# Patient Record
Sex: Female | Born: 1980
Health system: Southern US, Community
[De-identification: ages and names within clinical notes are randomized; demographics above are authoritative.]

## PROBLEM LIST (undated history)

## (undated) HISTORY — PX: HAND SURGERY: SHX662

## (undated) HISTORY — PX: TONSILLECTOMY: SUR1361

## (undated) HISTORY — PX: HERNIA REPAIR: SHX51

---

## 2007-09-04 ENCOUNTER — Other Ambulatory Visit: Admission: RE | Admit: 2007-09-04 | Discharge: 2007-09-04 | Payer: Self-pay | Admitting: Unknown Physician Specialty

## 2007-09-04 ENCOUNTER — Encounter (INDEPENDENT_AMBULATORY_CARE_PROVIDER_SITE_OTHER): Payer: Self-pay | Admitting: Unknown Physician Specialty

## 2010-03-22 ENCOUNTER — Emergency Department (HOSPITAL_COMMUNITY): Admission: EM | Admit: 2010-03-22 | Discharge: 2010-03-22 | Payer: Self-pay | Admitting: Emergency Medicine

## 2010-08-26 ENCOUNTER — Emergency Department (HOSPITAL_COMMUNITY)
Admission: EM | Admit: 2010-08-26 | Discharge: 2010-08-26 | Payer: Self-pay | Source: Home / Self Care | Admitting: Emergency Medicine

## 2010-11-15 ENCOUNTER — Emergency Department (HOSPITAL_COMMUNITY)
Admission: EM | Admit: 2010-11-15 | Discharge: 2010-11-15 | Payer: Self-pay | Source: Home / Self Care | Admitting: Emergency Medicine

## 2011-01-19 ENCOUNTER — Emergency Department (HOSPITAL_COMMUNITY)
Admission: EM | Admit: 2011-01-19 | Discharge: 2011-01-19 | Disposition: A | Discharge status: Home / Self Care | Payer: Medicaid Other | Attending: Emergency Medicine | Admitting: Emergency Medicine

## 2011-01-19 DIAGNOSIS — R109 Unspecified abdominal pain: Secondary | ICD-10-CM | POA: Insufficient documentation

## 2011-01-19 DIAGNOSIS — A499 Bacterial infection, unspecified: Secondary | ICD-10-CM | POA: Insufficient documentation

## 2011-01-19 DIAGNOSIS — B9689 Other specified bacterial agents as the cause of diseases classified elsewhere: Secondary | ICD-10-CM | POA: Insufficient documentation

## 2011-01-19 DIAGNOSIS — O2390 Unspecified genitourinary tract infection in pregnancy, unspecified trimester: Secondary | ICD-10-CM | POA: Insufficient documentation

## 2011-01-19 DIAGNOSIS — N760 Acute vaginitis: Secondary | ICD-10-CM | POA: Insufficient documentation

## 2011-01-19 LAB — URINALYSIS, ROUTINE W REFLEX MICROSCOPIC
Bilirubin Urine: NEGATIVE
Nitrite: NEGATIVE
Protein, ur: NEGATIVE mg/dL
Specific Gravity, Urine: >1.03 — ABNORMAL HIGH (ref 1.005–1.030)
Urobilinogen, UA: 0.2 mg/dL (ref 0.0–1.0)

## 2011-01-19 LAB — WET PREP, GENITAL: Trich, Wet Prep: NONE SEEN

## 2011-01-20 LAB — URINE CULTURE
Colony Count: NO GROWTH
Culture  Setup Time: 201202221140

## 2011-01-20 LAB — GC/CHLAMYDIA PROBE AMP, GENITAL
Chlamydia, DNA Probe: NEGATIVE
GC Probe Amp, Genital: NEGATIVE

## 2011-02-07 LAB — WET PREP, GENITAL
Trich, Wet Prep: NONE SEEN
Yeast Wet Prep HPF POC: NONE SEEN

## 2011-02-07 LAB — BASIC METABOLIC PANEL
BUN: 14 mg/dL (ref 6–23)
CO2: 26 mEq/L (ref 19–32)
Calcium: 9.2 mg/dL (ref 8.4–10.5)
Chloride: 106 mEq/L (ref 96–112)
Creatinine, Ser: 0.59 mg/dL (ref 0.4–1.2)
Glucose, Bld: 96 mg/dL (ref 70–99)

## 2011-02-07 LAB — CBC
MCH: 31.6 pg (ref 26.0–34.0)
MCHC: 37 g/dL — ABNORMAL HIGH (ref 30.0–36.0)
MCV: 85.4 fL (ref 78.0–100.0)
Platelets: 250 10*3/uL (ref 150–400)

## 2011-02-07 LAB — URINE MICROSCOPIC-ADD ON

## 2011-02-07 LAB — DIFFERENTIAL
Basophils Relative: 0 % (ref 0–1)
Eosinophils Absolute: 0.3 10*3/uL (ref 0.0–0.7)
Eosinophils Relative: 3 % (ref 0–5)
Lymphs Abs: 2.6 10*3/uL (ref 0.7–4.0)
Neutrophils Relative %: 64 % (ref 43–77)

## 2011-02-07 LAB — URINALYSIS, ROUTINE W REFLEX MICROSCOPIC
Glucose, UA: NEGATIVE mg/dL
Leukocytes, UA: NEGATIVE
Protein, ur: 30 mg/dL — AB
Urobilinogen, UA: 0.2 mg/dL (ref 0.0–1.0)

## 2011-02-07 LAB — POCT PREGNANCY, URINE: Preg Test, Ur: POSITIVE

## 2011-02-07 LAB — GC/CHLAMYDIA PROBE AMP, GENITAL: GC Probe Amp, Genital: NEGATIVE

## 2011-02-15 LAB — URINALYSIS, ROUTINE W REFLEX MICROSCOPIC
Glucose, UA: NEGATIVE mg/dL
Nitrite: NEGATIVE
Specific Gravity, Urine: 1.025 (ref 1.005–1.030)
pH: 6 (ref 5.0–8.0)

## 2011-02-15 LAB — PREGNANCY, URINE: Preg Test, Ur: NEGATIVE

## 2011-08-19 ENCOUNTER — Emergency Department (HOSPITAL_COMMUNITY)
Admission: EM | Admit: 2011-08-19 | Discharge: 2011-08-19 | Disposition: A | Discharge status: Home / Self Care | Payer: Medicaid Other | Attending: Emergency Medicine | Admitting: Emergency Medicine

## 2011-08-19 ENCOUNTER — Encounter: Payer: Self-pay | Admitting: *Deleted

## 2011-08-19 DIAGNOSIS — O8600 Infection of obstetric surgical wound, unspecified: Secondary | ICD-10-CM

## 2011-08-19 DIAGNOSIS — O909 Complication of the puerperium, unspecified: Secondary | ICD-10-CM | POA: Insufficient documentation

## 2011-08-19 MED ORDER — SULFAMETHOXAZOLE-TRIMETHOPRIM 800-160 MG PO TABS: 1.0000 | ORAL_TABLET | Freq: Two times a day (BID) | ORAL | Status: AC

## 2011-08-19 MED ORDER — SULFAMETHOXAZOLE-TMP DS 800-160 MG PO TABS
1.0000 | ORAL_TABLET | Freq: Once | ORAL | Status: AC
  Administered 2011-08-19: 1 via ORAL
  Filled 2011-08-19: qty 1

## 2011-08-19 NOTE — ED Provider Notes (Signed)
History   Scribed for Vida Roller, MD, the patient was seen in room APA06/APA06. This chart was scribed by Clarita Crane. This patient's care was started at 11:15PM.   CSN: [id number] Arrival date & time: 08/19/2011 10:00 PM  Chief Complaint  Patient presents with  . Coagulation Disorder    HPI   HPI Nicole Curtis is a 30 y.o. female who presents to the Emergency Department complaining of drainage from left side of incision site to abdomen c/w C-section performed on July 19, 2011 onset several days ago and persistent since with associated mild pain to area which is aggravated with palpation. Patient reports physician applied "glue" to incision site following the C-section performed and much of the glue is still present. Denies vaginal bleeding, fever, chills. Patient reports she received Depo-provera shot several weeks ago.   History reviewed. No pertinent past medical history.  Past Surgical History  Procedure Date  . Cesarean section   . Tonsillectomy   . Hernia repair     History reviewed. No pertinent family history.  History  Substance Use Topics  . Smoking status: Never Smoker   . Smokeless tobacco: Not on file  . Alcohol Use: No    OB History    Grav Para Term Preterm Abortions TAB SAB Ect Mult Living                  Review of Systems  Review of Systems 10 Systems reviewed and are negative for acute change except as noted in the HPI.  Allergies  Review of patient's allergies indicates no known allergies.  Home Medications   Current Outpatient Rx  Name Route Sig Dispense Refill  . MEDROXYPROGESTERONE ACETATE 150 MG/ML IM SUSP Intramuscular Inject 150 mg into the muscle every 3 (three) months.      . SULFAMETHOXAZOLE-TRIMETHOPRIM 800-160 MG PO TABS Oral Take 1 tablet by mouth every 12 (twelve) hours. 20 tablet 0    Physical Exam    BP 156/86  Pulse 65  Temp(Src) 98 F (36.7 C) (Oral)  Resp 20  Ht 5\' 4"  (1.626 m)  Wt 170 lb 3 oz (77.197 kg)   BMI 29.21 kg/m2  SpO2 100%  LMP 08/17/2011  Physical Exam  Nursing note and vitals reviewed. Constitutional: She is oriented to person, place, and time. She appears well-developed and well-nourished. No distress.  HENT:  Head: Normocephalic and atraumatic.  Eyes: EOM are normal.  Neck: Neck supple.  Cardiovascular: Normal rate and regular rhythm.  Exam reveals no gallop and no friction rub.   No murmur heard. Pulmonary/Chest: Effort normal and breath sounds normal. She has no wheezes.  Abdominal: Soft. Bowel sounds are normal. She exhibits no distension. There is no tenderness.       Left edge of incision to mid abdomen with spot of purulent drainage, with no surrounding erythema or induration. No peritoneal signs.  No palpable abscess and wound check. No peritoneal signs, no guarding, no tenderness to palpation anywhere in the abdomen.  Musculoskeletal: Normal range of motion. She exhibits no edema.  Neurological: She is alert and oriented to person, place, and time. No sensory deficit.  Skin: Skin is warm and dry. No erythema.  Psychiatric: She has a normal mood and affect. Her behavior is normal.    ED Course  Procedures  Labs Reviewed - No data to display No results found.   1. Wound infection following cesarean section, postpartum      MDM Overall the patient is very  well-appearing, will send wound culture and start on Septra. She is not breast-feeding, has no fever, has no abdominal tenderness or palpable abscess. It appears that she does have a very small postop wound infection which at this time does not require any further intervention. I have discussed with her the need for followup on Monday morning with her OB/GYN for a wound check and she is aware of indications for emergency department followup.  I personally performed the services described in this documentation, which was scribed in my presence. The recorded information has been reviewed and considered. Vida Roller, MD          Vida Roller, MD 08/19/11 [phone]

## 2011-08-19 NOTE — ED Notes (Signed)
Pt states she was at work and done something that caused her to have bleeding from her C-section site

## 2012-08-27 ENCOUNTER — Emergency Department (HOSPITAL_COMMUNITY)
Admission: EM | Admit: 2012-08-27 | Discharge: 2012-08-27 | Disposition: A | Discharge status: Home / Self Care | Payer: Worker's Compensation | Attending: Emergency Medicine | Admitting: Emergency Medicine

## 2012-08-27 ENCOUNTER — Encounter (HOSPITAL_COMMUNITY): Payer: Self-pay | Admitting: *Deleted

## 2012-08-27 ENCOUNTER — Emergency Department (HOSPITAL_COMMUNITY): Payer: Worker's Compensation

## 2012-08-27 DIAGNOSIS — S60212A Contusion of left wrist, initial encounter: Secondary | ICD-10-CM

## 2012-08-27 DIAGNOSIS — W1809XA Striking against other object with subsequent fall, initial encounter: Secondary | ICD-10-CM | POA: Insufficient documentation

## 2012-08-27 DIAGNOSIS — S63509A Unspecified sprain of unspecified wrist, initial encounter: Secondary | ICD-10-CM | POA: Insufficient documentation

## 2012-08-27 DIAGNOSIS — Y9269 Other specified industrial and construction area as the place of occurrence of the external cause: Secondary | ICD-10-CM | POA: Insufficient documentation

## 2012-08-27 NOTE — ED Provider Notes (Signed)
History     CSN: [id number]  Arrival date & time 08/27/12  1517   First MD Initiated Contact with Patient 08/27/12 1542      Chief Complaint  Patient presents with  . Wrist Injury    (Consider location/radiation/quality/duration/timing/severity/associated sxs/prior treatment) HPI.Marland KitchenMarland KitchenLeft Wrist pain after falling from ladder at work this morning.  Wrist struck machine.  No head or neck injury. Palpation makes pain worse. Severity is mild. History reviewed. No pertinent past medical history.  Past Surgical History  Procedure Date  . Cesarean section   . Tonsillectomy   . Hernia repair     History reviewed. No pertinent family history.  History  Substance Use Topics  . Smoking status: Never Smoker   . Smokeless tobacco: Not on file  . Alcohol Use: No    OB History    Grav Para Term Preterm Abortions TAB SAB Ect Mult Living                  Review of Systems  All other systems reviewed and are negative.    Allergies  Review of patient's allergies indicates no known allergies.  Home Medications  No current outpatient prescriptions on file.  BP 126/80  Pulse 93  Temp 98.3 F (36.8 C) (Oral)  Resp 20  Ht 5\' 4"  (1.626 m)  Wt 150 lb (68.04 kg)  BMI 25.75 kg/m2  SpO2 99%  LMP 08/10/2012  Physical Exam  Nursing note and vitals reviewed. Constitutional: She is oriented to person, place, and time. She appears well-developed and well-nourished.  HENT:  Head: Normocephalic and atraumatic.  Eyes: Conjunctivae normal and EOM are normal. Pupils are equal, round, and reactive to light.  Neck: Normal range of motion. Neck supple.  Cardiovascular: Normal rate, regular rhythm and normal heart sounds.   Pulmonary/Chest: Effort normal and breath sounds normal.  Abdominal: Soft. Bowel sounds are normal.  Musculoskeletal:       Left wrist: Tender in radial and ulnar aspect of wrist.  Pain with range of motion.  Neurological: She is alert and oriented to person, place,  and time.  Skin: Skin is warm and dry.  Psychiatric: She has a normal mood and affect.    ED Course  Procedures (including critical care time)  Labs Reviewed - No data to display Dg Wrist Complete Left  08/27/2012  *RADIOLOGY REPORT*  Clinical Data: Fall from ladder  LEFT WRIST - COMPLETE 3+ VIEW  Comparison: None.  Findings: No distal radius or ulnar fracture.  The radiocarpal joint is intact.  No carpal fracture.  IMPRESSION: No wrist fracture.   Original Report Authenticated By: Genevive Bi, M.D.      1. Strain of left wrist       MDM  X-ray left wrist negative.  No other injuries.  Spl[place], workers comp sheet filled out        Donnetta Hutching, MD 08/27/12 [phone]

## 2012-08-27 NOTE — ED Notes (Signed)
Patient transported to X-ray 

## 2012-08-27 NOTE — ED Notes (Signed)
MD at bedside. 

## 2012-08-27 NOTE — ED Notes (Signed)
Lt wrist injury this am at work, was on step ladder and tipped over , striking wrist on a machine,

## 2013-03-10 ENCOUNTER — Encounter (HOSPITAL_COMMUNITY): Payer: Self-pay | Admitting: *Deleted

## 2013-03-10 ENCOUNTER — Emergency Department (HOSPITAL_COMMUNITY)
Admission: EM | Admit: 2013-03-10 | Discharge: 2013-03-10 | Disposition: A | Discharge status: Home / Self Care | Payer: Medicaid Other | Attending: Emergency Medicine | Admitting: Emergency Medicine

## 2013-03-10 DIAGNOSIS — G43909 Migraine, unspecified, not intractable, without status migrainosus: Secondary | ICD-10-CM

## 2013-03-10 DIAGNOSIS — H538 Other visual disturbances: Secondary | ICD-10-CM | POA: Insufficient documentation

## 2013-03-10 DIAGNOSIS — R112 Nausea with vomiting, unspecified: Secondary | ICD-10-CM | POA: Insufficient documentation

## 2013-03-10 DIAGNOSIS — H53149 Visual discomfort, unspecified: Secondary | ICD-10-CM | POA: Insufficient documentation

## 2013-03-10 MED ORDER — NAPROXEN 500 MG PO TABS: 500.0000 mg | ORAL_TABLET | Freq: Two times a day (BID) | ORAL | Status: DC

## 2013-03-10 MED ORDER — OXYCODONE-ACETAMINOPHEN 5-325 MG PO TABS
1.0000 | ORAL_TABLET | Freq: Once | ORAL | Status: AC
  Administered 2013-03-10: 1 via ORAL
  Filled 2013-03-10: qty 1

## 2013-03-10 MED ORDER — ONDANSETRON 8 MG PO TBDP
8.0000 mg | ORAL_TABLET | Freq: Once | ORAL | Status: AC
  Administered 2013-03-10: 8 mg via ORAL
  Filled 2013-03-10: qty 1

## 2013-03-10 MED ORDER — METOCLOPRAMIDE HCL 5 MG/ML IJ SOLN
10.0000 mg | Freq: Once | INTRAMUSCULAR | Status: AC
  Administered 2013-03-10: 10 mg via INTRAMUSCULAR
  Filled 2013-03-10: qty 2

## 2013-03-10 MED ORDER — KETOROLAC TROMETHAMINE 60 MG/2ML IM SOLN
60.0000 mg | Freq: Once | INTRAMUSCULAR | Status: AC
  Administered 2013-03-10: 60 mg via INTRAMUSCULAR
  Filled 2013-03-10: qty 2

## 2013-03-10 MED ORDER — ONDANSETRON 8 MG PO TBDP: 8.0000 mg | ORAL_TABLET | Freq: Three times a day (TID) | ORAL | Status: DC | PRN

## 2013-03-10 NOTE — ED Notes (Signed)
Pt c/o migraine x 2days with n/v

## 2013-03-10 NOTE — ED Provider Notes (Signed)
History     CSN: [id number]  Arrival date & time 03/10/13  1933   First MD Initiated Contact with Patient 03/10/13 1956      Chief Complaint  Patient presents with  . Headache  . Nausea  . Emesis    (Consider location/radiation/quality/duration/timing/severity/associated sxs/prior treatment) The history is provided by the patient.   patient reports development of headache over the past 24 hours.  She has photophobia without phonophobia.  Bilateral blurred vision.  No weakness of her upper lower extremities.  No recent falls or head injury.  No anticoagulant use.  Her headache was gradual in onset.  Her headache is mild to moderate in severity.  It is not relieved with any over-the-counter medications.  She has had a headache one other time before like this and was diagnosed as a migraine headache.  She has not seen a neurologist.  No fevers or chills.  No neck pain or neck stiffness.  No chest pain or shortness of breath.  No abdominal pain.  She has had some diarrhea the past 24 hours.  She's otherwisebeen able to keep somefluids down today.  No urinary symptoms  History reviewed. No pertinent past medical history.  Past Surgical History  Procedure Laterality Date  . Cesarean section    . Tonsillectomy    . Hernia repair    . Hand surgery      History reviewed. No pertinent family history.  History  Substance Use Topics  . Smoking status: Never Smoker   . Smokeless tobacco: Not on file  . Alcohol Use: No    OB History   Grav Para Term Preterm Abortions TAB SAB Ect Mult Living                  Review of Systems  Gastrointestinal: Positive for vomiting.  Neurological: Positive for headaches.  All other systems reviewed and are negative.    Allergies  Review of patient's allergies indicates no known allergies.  Home Medications   Current Outpatient Rx  Name  Route  Sig  Dispense  Refill  . naproxen (NAPROSYN) 500 MG tablet   Oral   Take 1 tablet (500 mg  total) by mouth 2 (two) times daily.   10 tablet   0   . ondansetron (ZOFRAN ODT) 8 MG disintegrating tablet   Oral   Take 1 tablet (8 mg total) by mouth every 8 (eight) hours as needed for nausea.   10 tablet   0     BP 119/84  Pulse 113  Temp(Src) 97.9 F (36.6 C) (Oral)  Resp 20  Ht 5\' 4"  (1.626 m)  Wt 160 lb (72.576 kg)  BMI 27.45 kg/m2  SpO2 100%  LMP 03/03/2013  Physical Exam  Nursing note and vitals reviewed. Constitutional: She is oriented to person, place, and time. She appears well-developed and well-nourished. No distress.  HENT:  Head: Normocephalic and atraumatic.  Eyes: EOM are normal. Pupils are equal, round, and reactive to light.  Neck: Normal range of motion.  Cardiovascular: Normal rate, regular rhythm and normal heart sounds.   Pulmonary/Chest: Effort normal and breath sounds normal.  Abdominal: Soft. She exhibits no distension. There is no tenderness.  Musculoskeletal: Normal range of motion.  Neurological: She is alert and oriented to person, place, and time.  5/5 strength in major muscle groups of  bilateral upper and lower extremities. Speech normal. No facial asymetry.   Skin: Skin is warm and dry.  Psychiatric: She has a normal  mood and affect. Judgment normal.    ED Course  Procedures (including critical care time)  Labs Reviewed - No data to display No results found.   1. Migraine       MDM   Non focal neuro exam. No recent head trauma. No fever. Doubt meningitis. Doubt intracranial bleed. Doubt normal pressure hydrocephalus. No indication for imaging. Will treat with migraine cocktail and reevaluate  8:32 PM Patient feels much better at this time.  Discharge home in good condition.  Home with instructions for Naprosyn use         Lyanne Co, MD 03/10/13 2034

## 2013-05-24 IMAGING — CR DG WRIST COMPLETE 3+V*L*
4 series · 4 of 4 positions shown · non-contrast
Comparison: None.

CLINICAL DATA: Fall from ladder

LEFT WRIST - COMPLETE 3+ VIEW

[view not recorded (1 of 4)]
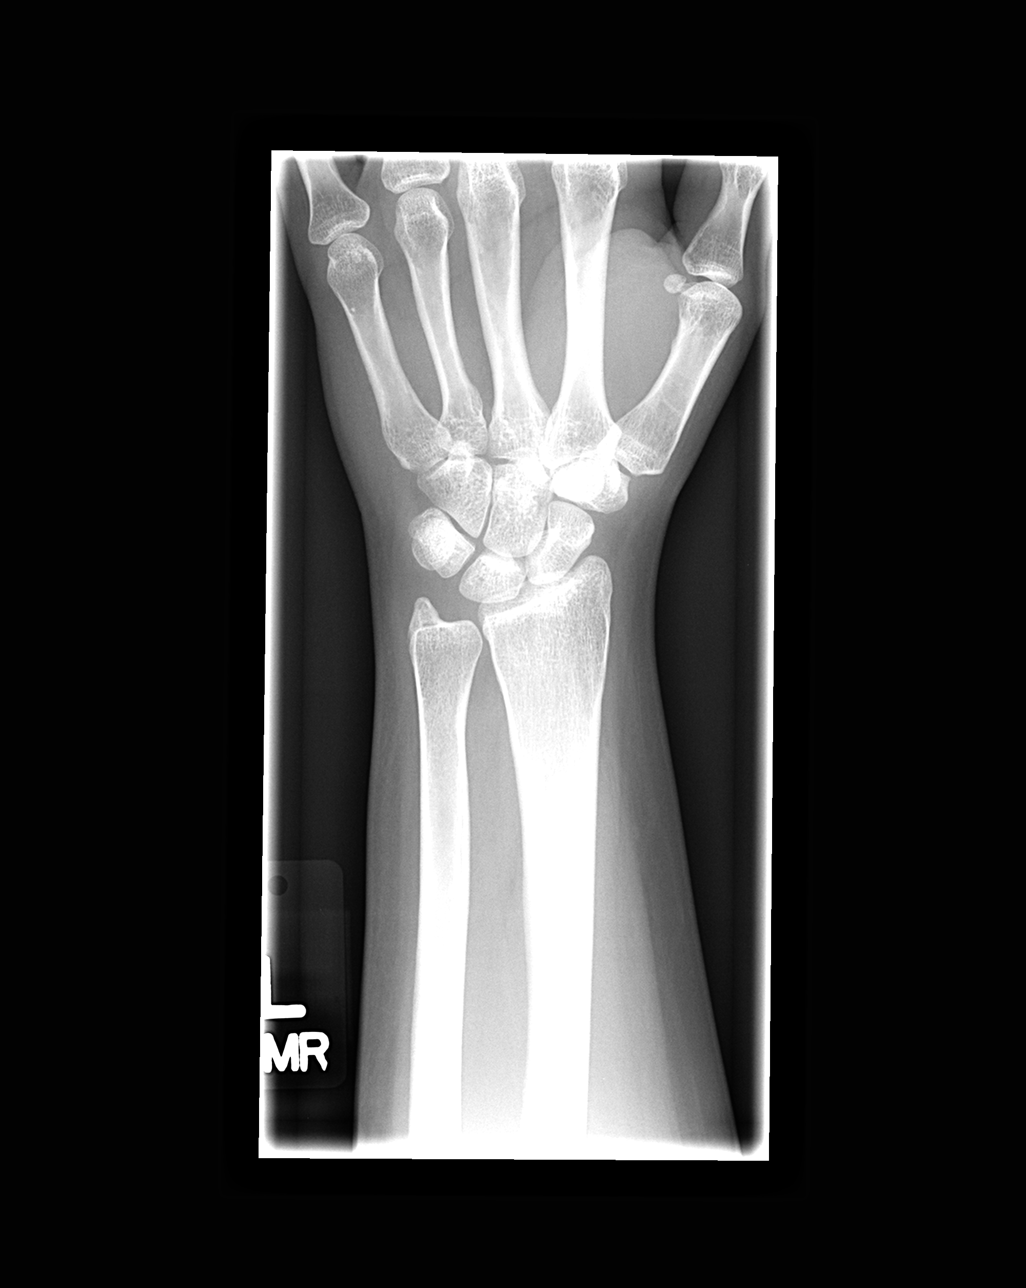

[view not recorded (2 of 4)]
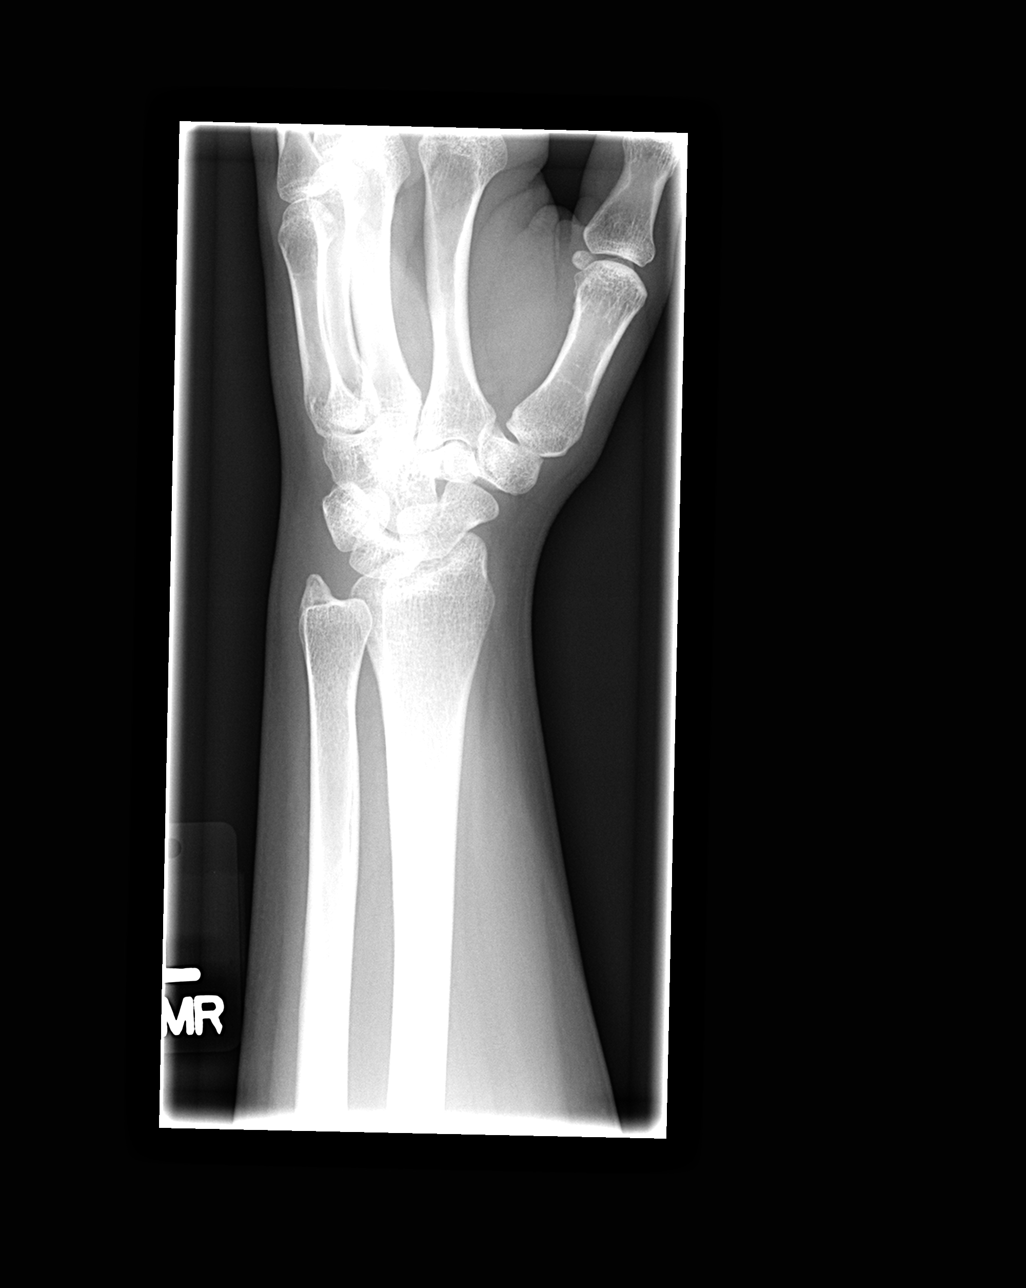

[view not recorded (3 of 4)]
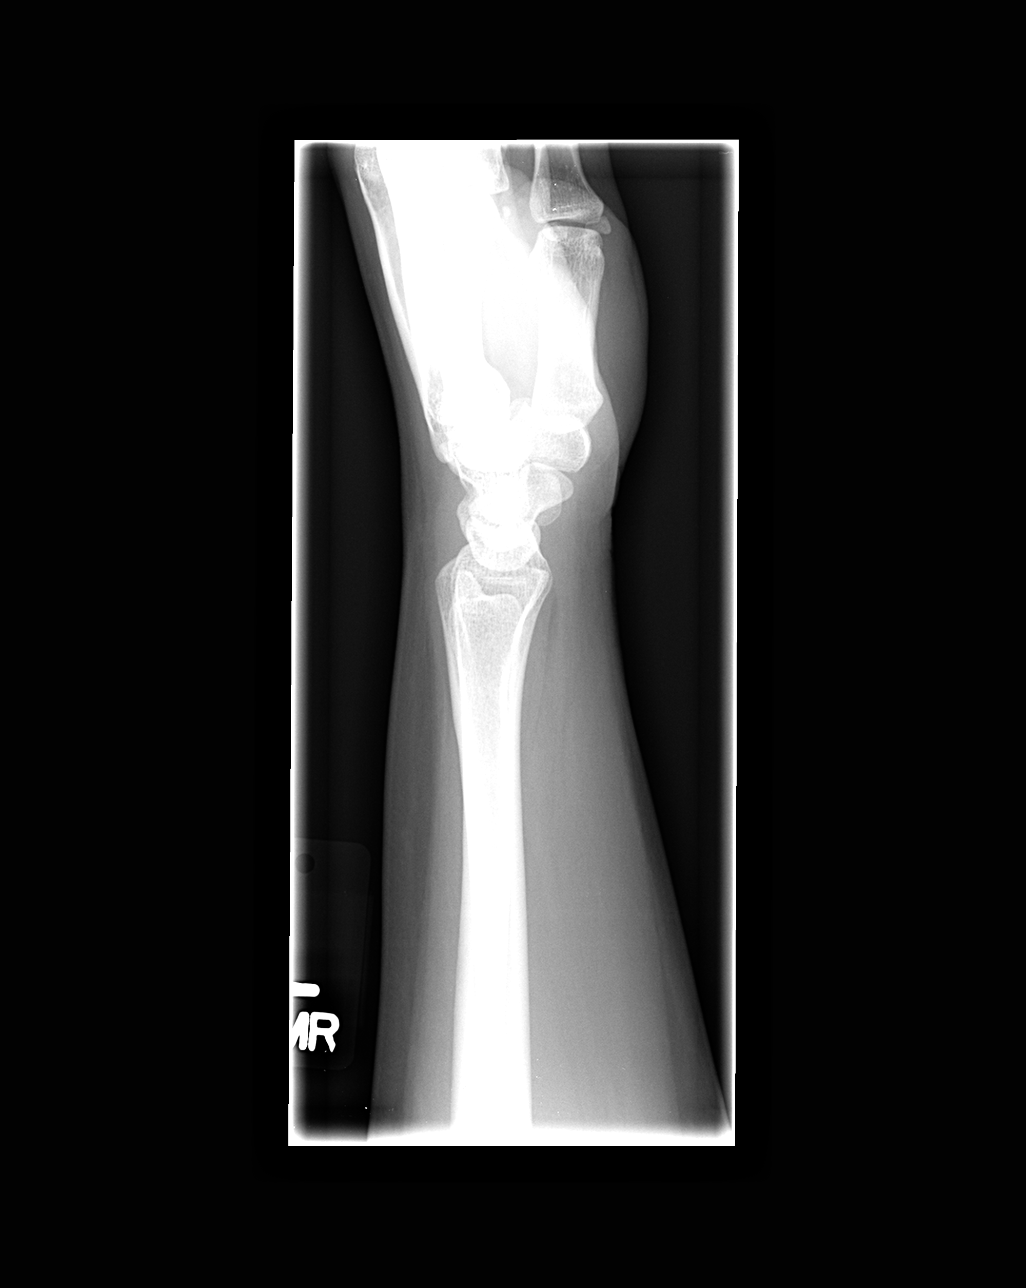

[view not recorded (4 of 4)]
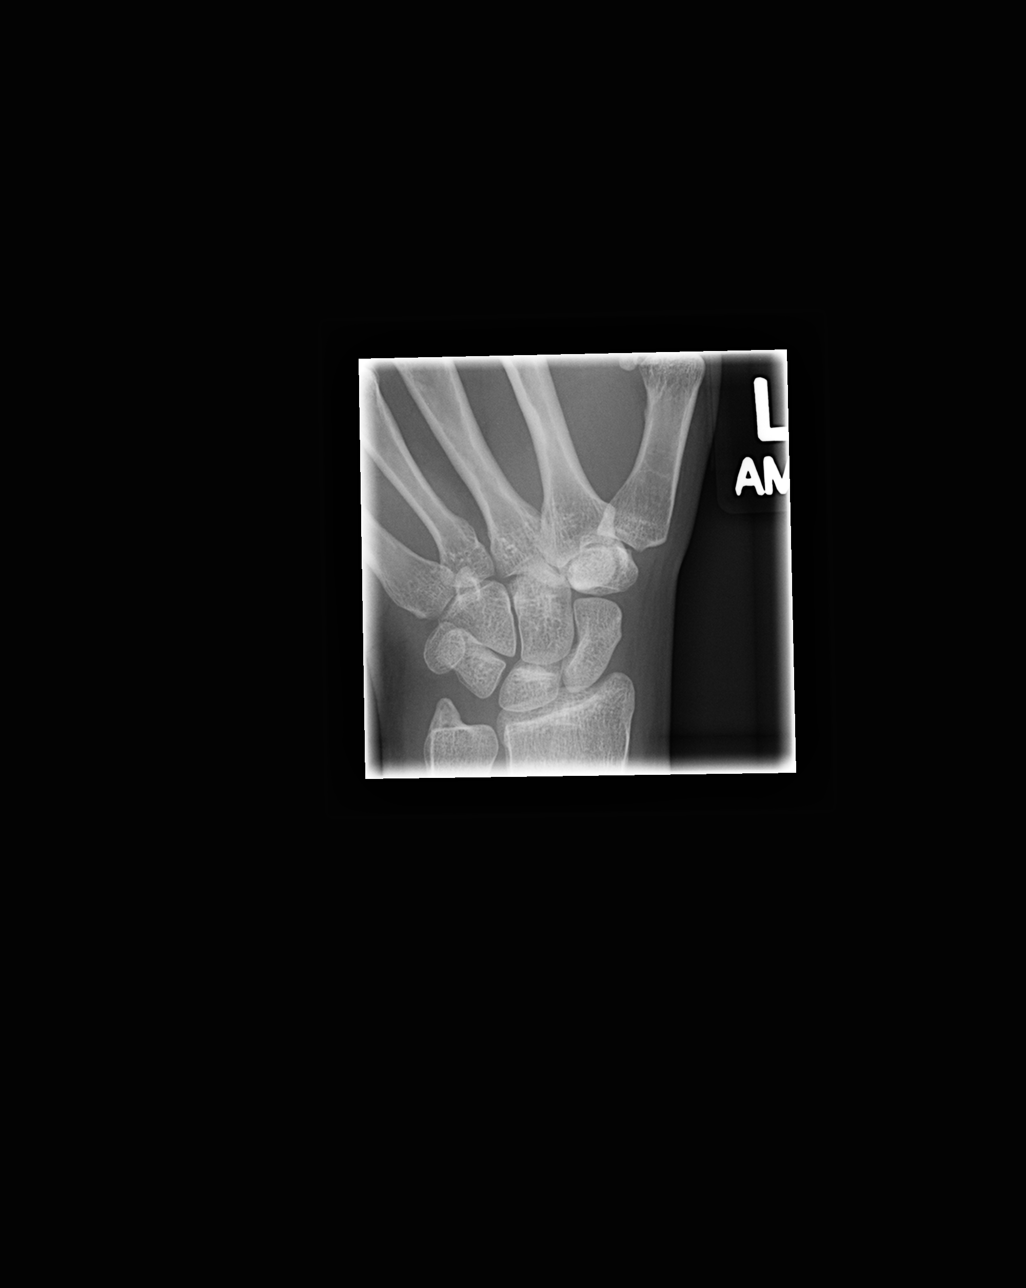

[4 of 4 positions shown; findings below may reference images not displayed]

FINDINGS: No distal radius or ulnar fracture.  The radiocarpal
joint is intact.  No carpal fracture.
IMPRESSION: No wrist fracture.

## 2014-11-01 ENCOUNTER — Emergency Department (HOSPITAL_COMMUNITY)
Admission: EM | Admit: 2014-11-01 | Discharge: 2014-11-01 | Disposition: A | Discharge status: Home / Self Care | Payer: BC Managed Care – PPO | Attending: Emergency Medicine | Admitting: Emergency Medicine

## 2014-11-01 ENCOUNTER — Encounter (HOSPITAL_COMMUNITY): Payer: Self-pay | Admitting: Emergency Medicine

## 2014-11-01 DIAGNOSIS — J029 Acute pharyngitis, unspecified: Secondary | ICD-10-CM | POA: Diagnosis present

## 2014-11-01 DIAGNOSIS — Z79899 Other long term (current) drug therapy: Secondary | ICD-10-CM | POA: Insufficient documentation

## 2014-11-01 DIAGNOSIS — Z791 Long term (current) use of non-steroidal anti-inflammatories (NSAID): Secondary | ICD-10-CM | POA: Diagnosis not present

## 2014-11-01 DIAGNOSIS — B349 Viral infection, unspecified: Secondary | ICD-10-CM

## 2014-11-01 LAB — RAPID STREP SCREEN (MED CTR MEBANE ONLY): STREPTOCOCCUS, GROUP A SCREEN (DIRECT): NEGATIVE

## 2014-11-01 NOTE — Discharge Instructions (Signed)
Strep test negative.  Increase fluids.  Tylenol or ibuprofen for pain or fever. Gargle with salt water. Good handwashing.

## 2014-11-01 NOTE — ED Notes (Addendum)
Patient complaining of sore throat x 3-4 days with chills starting today. Patient also complaining of generalized body aches.

## 2014-11-01 NOTE — ED Provider Notes (Signed)
CSN: [id number]     Arrival date & time 11/01/14  1154 History  This chart was scribed for Nicole Curtis by Nicole Curtis, ED Scribe. This patient was seen in room APA03/APA03 and the patient's care was started at 1:40 PM.    Chief Complaint  Patient presents with  . Sore Throat  . Chills   The history is provided by the patient. No language interpreter was used.    HPI Comments: Nicole Curtis is a 33 y.o. female who presents to the Emergency Department complaining of constant, moderate sore throat that started 3-4 days ago. She has mild voice loss, chills and subjective fever as an associated symptoms. Pt works as a Location managermachine operator. She denies positive sick contact. Pt also denies ear pain as an associated symptom.  History reviewed. No pertinent past medical history. Past Surgical History  Procedure Laterality Date  . Cesarean section    . Tonsillectomy    . Hernia repair    . Hand surgery     History reviewed. No pertinent family history. History  Substance Use Topics  . Smoking status: Never Smoker   . Smokeless tobacco: Not on file  . Alcohol Use: No   OB History    No data available     Review of Systems  10 Systems reviewed and all are negative for acute change except as noted in the HPI.  Allergies  Bactrim  Home Medications   Prior to Admission medications   Medication Sig Start Date End Date Taking? Authorizing Provider  naproxen (NAPROSYN) 500 MG tablet Take 1 tablet (500 mg total) by mouth 2 (two) times daily. 03/10/13   Lyanne CoKevin M Campos, Curtis  ondansetron (ZOFRAN ODT) 8 MG disintegrating tablet Take 1 tablet (8 mg total) by mouth every 8 (eight) hours as needed for nausea. 03/10/13   Lyanne CoKevin M Campos, Curtis   BP 153/92 mmHg  Pulse 89  Temp(Src) 97.7 F (36.5 C) (Oral)  Resp 16  Ht 5\' 3"  (1.6 m)  Wt 161 lb (73.029 kg)  BMI 28.53 kg/m2  SpO2 100%  LMP 10/25/2014 Physical Exam  Constitutional: She is oriented to person, place, and time. She appears  well-developed and well-nourished.  HENT:  Head: Normocephalic and atraumatic.  Mouth/Throat: No oropharyngeal exudate.  Throat minimally erythematous  Eyes: Conjunctivae and EOM are normal. Pupils are equal, round, and reactive to light.  Neck: Normal range of motion. Neck supple.  Cardiovascular: Normal rate, regular rhythm and normal heart sounds.   Pulmonary/Chest: Effort normal and breath sounds normal.  Abdominal: Soft. Bowel sounds are normal.  Musculoskeletal: Normal range of motion.  Neurological: She is alert and oriented to person, place, and time.  Skin: Skin is warm and dry.  Psychiatric: She has a normal mood and affect. Her behavior is normal.  Nursing note and vitals reviewed.   ED Course  Procedures (including critical care time) DIAGNOSTIC STUDIES: Oxygen Saturation is 100% on RA, normal by my interpretation.    COORDINATION OF CARE: 1:45 PM Discussed treatment plan with pt and she agreed to plan. Advised pt to treat symptoms with gargling salt water, Ibuprofen and rest.  Labs Review Labs Reviewed  RAPID STREP SCREEN  CULTURE, GROUP A STREP    Imaging Review No results found.   EKG Interpretation None      MDM   Final diagnoses:  Viral syndrome    Patient is nontoxic-appearing. Not dehydrated. Strep test negative. Discussed test results with patient and her mother. Symptomatic  treatment only.  I personally performed the services described in this documentation, which was scribed in my presence. The recorded information has been reviewed and is accurate.    Nicole HutchingBrian Lekisha Mcghee, Curtis 11/01/14 [phone]

## 2014-11-03 LAB — CULTURE, GROUP A STREP

## 2015-01-19 ENCOUNTER — Emergency Department (HOSPITAL_COMMUNITY)
Admission: EM | Admit: 2015-01-19 | Discharge: 2015-01-19 | Disposition: A | Discharge status: Home / Self Care | Payer: 59 | Attending: Emergency Medicine | Admitting: Emergency Medicine

## 2015-01-19 ENCOUNTER — Encounter (HOSPITAL_COMMUNITY): Payer: Self-pay | Admitting: Emergency Medicine

## 2015-01-19 ENCOUNTER — Emergency Department (HOSPITAL_COMMUNITY): Payer: 59

## 2015-01-19 DIAGNOSIS — R079 Chest pain, unspecified: Secondary | ICD-10-CM | POA: Diagnosis present

## 2015-01-19 DIAGNOSIS — Z791 Long term (current) use of non-steroidal anti-inflammatories (NSAID): Secondary | ICD-10-CM | POA: Insufficient documentation

## 2015-01-19 DIAGNOSIS — R1110 Vomiting, unspecified: Secondary | ICD-10-CM | POA: Diagnosis not present

## 2015-01-19 DIAGNOSIS — R0789 Other chest pain: Secondary | ICD-10-CM | POA: Insufficient documentation

## 2015-01-19 DIAGNOSIS — R42 Dizziness and giddiness: Secondary | ICD-10-CM | POA: Insufficient documentation

## 2015-01-19 LAB — D-DIMER, QUANTITATIVE (NOT AT ARMC)

## 2015-01-19 LAB — TROPONIN I: Troponin I: <0.03 ng/mL (ref <0.031)

## 2015-01-19 MED ORDER — HYDROCODONE-ACETAMINOPHEN 5-325 MG PO TABS: 1.0000 | ORAL_TABLET | ORAL | Status: DC | PRN

## 2015-01-19 MED ORDER — NAPROXEN 500 MG PO TABS: 500.0000 mg | ORAL_TABLET | Freq: Two times a day (BID) | ORAL | Status: DC

## 2015-01-19 MED ORDER — METHOCARBAMOL 500 MG PO TABS
1000.0000 mg | ORAL_TABLET | Freq: Once | ORAL | Status: AC
  Administered 2015-01-19: 1000 mg via ORAL
  Filled 2015-01-19: qty 2

## 2015-01-19 NOTE — ED Provider Notes (Signed)
CSN: [id number]     Arrival date & time 01/19/15  0903 History   First MD Initiated Contact with Patient 01/19/15 [phone]     Chief Complaint  Patient presents with  . Chest Pain     (Consider location/radiation/quality/duration/timing/severity/associated sxs/prior Treatment) Patient is a 34 y.o. female presenting with chest pain. The history is provided by the patient.  Chest Pain Pain location:  L chest Pain quality: tightness   Radiates to: Moves from left upper chest to the center of the chest. Pain radiates to the back: no   Pain severity:  Mild Onset quality:  Sudden Duration:  2 hours Timing:  Constant Progression:  Unchanged Chronicity:  New Context: breathing   Context: no trauma   Relieved by:  Nothing Worsened by:  Nothing tried Ineffective treatments:  None tried Associated symptoms: dizziness and vomiting   Associated symptoms: no abdominal pain, no back pain, no cough, no diaphoresis, no fever, no numbness, no PND, no shortness of breath and no syncope   Risk factors: no aortic disease, no diabetes mellitus, no hypertension, not obese, no prior DVT/PE and no smoking     History reviewed. No pertinent past medical history. Past Surgical History  Procedure Laterality Date  . Cesarean section    . Tonsillectomy    . Hernia repair    . Hand surgery     History reviewed. No pertinent family history. History  Substance Use Topics  . Smoking status: Never Smoker   . Smokeless tobacco: Never Used  . Alcohol Use: No   OB History    Gravida Para Term Preterm AB TAB SAB Ectopic Multiple Living   4 3  3 1  1         Review of Systems  Constitutional: Negative for fever and diaphoresis.  Respiratory: Negative for cough and shortness of breath.   Cardiovascular: Positive for chest pain. Negative for syncope and PND.  Gastrointestinal: Positive for vomiting. Negative for abdominal pain.  Musculoskeletal: Negative for back pain.  Neurological: Positive for  dizziness. Negative for numbness.  All other systems reviewed and are negative.     Allergies  Bactrim  Home Medications   Prior to Admission medications   Medication Sig Start Date End Date Taking? Authorizing Provider  naproxen (NAPROSYN) 500 MG tablet Take 1 tablet (500 mg total) by mouth 2 (two) times daily. 03/10/13   Lyanne CoKevin M Campos, MD  ondansetron (ZOFRAN ODT) 8 MG disintegrating tablet Take 1 tablet (8 mg total) by mouth every 8 (eight) hours as needed for nausea. 03/10/13   Lyanne CoKevin M Campos, MD   BP 156/110 mmHg  Pulse 100  Temp(Src) 98.1 F (36.7 C) (Oral)  Resp 18  Ht 5\' 3"  (1.6 m)  Wt 155 lb (70.308 kg)  BMI 27.46 kg/m2  SpO2 100%  LMP 01/17/2015 Physical Exam  Constitutional: She is oriented to person, place, and time. She appears well-developed and well-nourished.  Non-toxic appearance.  HENT:  Head: Normocephalic.  Right Ear: Tympanic membrane and external ear normal.  Left Ear: Tympanic membrane and external ear normal.  Eyes: EOM and lids are normal. Pupils are equal, round, and reactive to light.  Neck: Normal range of motion. Neck supple. Carotid bruit is not present.  Cardiovascular: Normal rate, regular rhythm, normal heart sounds, intact distal pulses and normal pulses.   Pulmonary/Chest: Breath sounds normal. No respiratory distress.  Pt speaks in complete sentences with out problem.  Abdominal: Soft. Bowel sounds are normal. There is no tenderness. There  is no guarding.  Musculoskeletal: Normal range of motion.  Lymphadenopathy:       Head (right side): No submandibular adenopathy present.       Head (left side): No submandibular adenopathy present.    She has no cervical adenopathy.  Neurological: She is alert and oriented to person, place, and time. She has normal strength. No cranial nerve deficit or sensory deficit.  Skin: Skin is warm and dry.  Psychiatric: She has a normal mood and affect. Her speech is normal.  Nursing note and vitals  reviewed.   ED Course  Procedures (including critical care time) Labs Review Labs Reviewed - No data to display  Imaging Review No results found.   EKG Interpretation None      MDM  Vital signs stable. B/P elevated at 150/99. PUlse ox 100% on room air.  D dimer neg . Troponin neg for cardiac event. EKG non-acute. Suspect chest wall pain. Pt to be treated with robaxin and add naproxen and norco. Pt to return to ED if any changes or problem. Pt ambulatory without problem.   Final diagnoses:  Chest wall pain    **I have reviewed nursing notes, vital signs, and all appropriate lab and imaging results for this patient.Kathie Dike, PA-C 01/21/15 1910  Vida Roller, MD 01/22/15 [phone]

## 2015-01-19 NOTE — Discharge Instructions (Signed)

## 2015-01-19 NOTE — ED Notes (Signed)
Pt reports central chest pain with SOB, dizziness and emesis. Onset of symptoms approx 2 hours ago.

## 2015-01-19 NOTE — ED Provider Notes (Signed)
Pt is a 34 y/o female who presents with acut right eye.e onset of chest pain when at work this morning.  she endorses having a recent fall 2 days ago onto her bottom down 3 steps, also struck her left forearm but has been ambulatory. She went to work today, had acute onset of chest pain, shortness of breath and dizziness, states that she still has the chest pain which is worse with deep breathing and worse with palpation of the chest wall. On exam the patient has reproducible tenderness over the sternum and left parasternal muscles, normal lung sounds, normal heart sounds, no peripheral edema, mild contusion over the left forearm, EKG is unremarkable, chest x-ray will be ordered to rule out pulmonary abnormalities such as pneumothorax. Anticipate discharge if normal.   ED ECG REPORT  I personally interpreted this EKG   Date: 01/19/2015   Rate: 97  Rhythm: normal sinus rhythm  QRS Axis: normal  Intervals: normal  ST/T Wave abnormalities: normal  Conduction Disutrbances:none  Narrative Interpretation:   Old EKG Reviewed: none available   Medical screening examination/treatment/procedure(s) were conducted as a shared visit with non-physician practitioner(s) and myself.  I personally evaluated the patient during the encounter.  Clinical Impression:   Final diagnoses:  Chest wall pain       Vida RollerBrian D Jackie Russman, MD 01/19/15 [phone]

## 2016-12-25 ENCOUNTER — Encounter (HOSPITAL_COMMUNITY): Payer: Self-pay | Admitting: Emergency Medicine

## 2016-12-25 ENCOUNTER — Emergency Department (HOSPITAL_COMMUNITY)
Admission: EM | Admit: 2016-12-25 | Discharge: 2016-12-25 | Disposition: A | Discharge status: Home / Self Care | Payer: 59 | Attending: Emergency Medicine | Admitting: Emergency Medicine

## 2016-12-25 DIAGNOSIS — M545 Low back pain, unspecified: Secondary | ICD-10-CM

## 2016-12-25 DIAGNOSIS — Z79899 Other long term (current) drug therapy: Secondary | ICD-10-CM | POA: Insufficient documentation

## 2016-12-25 MED ORDER — METHOCARBAMOL 500 MG PO TABS: 500.0000 mg | ORAL_TABLET | Freq: Two times a day (BID) | ORAL | 0 refills | Status: AC

## 2016-12-25 MED ORDER — NAPROXEN 500 MG PO TABS: 500.0000 mg | ORAL_TABLET | Freq: Two times a day (BID) | ORAL | 0 refills | Status: AC

## 2016-12-25 MED ORDER — KETOROLAC TROMETHAMINE 60 MG/2ML IM SOLN
60.0000 mg | Freq: Once | INTRAMUSCULAR | Status: AC
  Administered 2016-12-25: 60 mg via INTRAMUSCULAR
  Filled 2016-12-25: qty 2

## 2016-12-25 MED ORDER — METHOCARBAMOL 500 MG PO TABS
500.0000 mg | ORAL_TABLET | Freq: Once | ORAL | Status: AC
  Administered 2016-12-25: 500 mg via ORAL
  Filled 2016-12-25: qty 1

## 2016-12-25 NOTE — Discharge Instructions (Signed)
Take the prescribed medication as directed.  May use heat therapy as well. Follow-up with your primary care doctor if not improving in the next few days. Return to the ED for new or worsening symptoms.

## 2016-12-25 NOTE — ED Provider Notes (Signed)
[facility]-EMERGENCY DEPT Provider Note   CSN: [id number] Arrival date & time: 12/25/16  1726   By signing my name below, I, Cynda Acres, attest that this documentation has been prepared under the direction and in the presence of Sharilyn Sites, PA-C. Electronically Signed: Cynda Acres, Scribe. 12/25/16. 6:18 PM.  History   Chief Complaint Chief Complaint  Patient presents with  . Back Pain   HPI Comments: Nicole Curtis is a 36 y.o. female who presents to the Emergency Department complaining of constant right lower back pain that began yesterday evening. Patient states she was at work when the back pain onset last night, unsure what happened. 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  Patient states it feels as if she has a knot in her back. Patient describes the pain as sharp. Patient reports taking aleve with no improvement in pain. Patient states the pain is worse when applying pressure on her feet while walking. Patient denies any traumatic injury, fall, or any other symptoms. Patient is right handed so often moves to her right side frequently.  The history is provided by the patient. No language interpreter was used.    History reviewed. No pertinent past medical history.  There are no active problems to display for this patient.   Past Surgical History:  Procedure Laterality Date  . CESAREAN SECTION    . HAND SURGERY    . HERNIA REPAIR    . TONSILLECTOMY      OB History    Gravida Para Term Preterm AB Living   4 3   3 1      SAB TAB Ectopic Multiple Live Births   1               Home Medications    Prior to Admission medications   Medication Sig Start Date End Date Taking? Authorizing Provider  amitriptyline (ELAVIL) 25 MG tablet Take 25 mg by mouth at bedtime. 12/23/14   Historical Provider, MD  HYDROcodone-acetaminophen (NORCO/VICODIN) 5-325 MG per tablet Take 1 tablet by mouth every 4 (four) hours as needed. 01/19/15   Ivery Quale, PA-C  Multiple  Vitamins-Calcium (ONE-A-DAY WOMENS PO) Take 1 tablet by mouth daily.    Historical Provider, MD  naproxen (NAPROSYN) 500 MG tablet Take 1 tablet (500 mg total) by mouth 2 (two) times daily. Take with food. 01/19/15   Ivery Quale, PA-C  ondansetron (ZOFRAN ODT) 8 MG disintegrating tablet Take 1 tablet (8 mg total) by mouth every 8 (eight) hours as needed for nausea. Patient not taking: Reported on 01/19/2015 03/10/13   Azalia Bilis, MD  phentermine (ADIPEX-P) 37.5 MG tablet Take 37.5 mg by mouth daily before breakfast.    Historical Provider, MD  SPRINTEC 28 0.25-35 MG-MCG tablet Take 1 tablet by mouth daily. 12/23/14   Historical Provider, MD    Family History No family history on file.  Social History Social History  Substance Use Topics  . Smoking status: Never Smoker  . Smokeless tobacco: Never Used  . Alcohol use No     Allergies   Bactrim [sulfamethoxazole-trimethoprim]   Review of Systems Review of Systems  Musculoskeletal: Positive for back pain.  All other systems reviewed and are negative.    Physical Exam Updated Vital Signs BP 145/91 (BP Location: Left Arm)   Pulse 95   Temp 98.2 F (36.8 C) (Oral)   Resp 17   Ht 5\' 4"  (1.626 m)   Wt 158 lb (71.7 kg)   SpO2 100%  BMI 27.12 kg/m   Physical Exam  Constitutional: She is oriented to person, place, and time. She appears well-developed and well-nourished.  HENT:  Head: Normocephalic and atraumatic.  Mouth/Throat: Oropharynx is clear and moist.  Eyes: Conjunctivae and EOM are normal. Pupils are equal, round, and reactive to light.  Neck: Normal range of motion.  Cardiovascular: Normal rate, regular rhythm and normal heart sounds.   Pulmonary/Chest: Effort normal and breath sounds normal.  Abdominal: Soft. Bowel sounds are normal.  Musculoskeletal: Normal range of motion.  TTP of right lumbar paraspinal muscles on exam; no midline tenderness, step-off, or deformity; full ROM maintained; negative SLR  bilaterally; normal strength/sensation of both legs, normal gait  Neurological: She is alert and oriented to person, place, and time.  Skin: Skin is warm and dry.  Psychiatric: She has a normal mood and affect.  Nursing note and vitals reviewed.    ED Treatments / Results  DIAGNOSTIC STUDIES: Oxygen Saturation is 100% on RA, normal by my interpretation.    COORDINATION OF CARE: 6:17 PM Discussed treatment plan with pt at bedside and pt agreed to plan.   Labs (all labs ordered are listed, but only abnormal results are displayed) Labs Reviewed - No data to display  EKG  EKG Interpretation None       Radiology No results found.  Procedures Procedures (including critical care time)  Medications Ordered in ED Medications - No data to display   Initial Impression / Assessment and Plan / ED Course  I have reviewed the triage vital signs and the nursing notes.  Pertinent labs & imaging results that were available during my care of the patient were reviewed by me and considered in my medical decision making (see chart for details).  [age] year old female here with right lower back pain, onset yesterday at work. She does report she moves boxes frequently at work. She is afebrile and nontoxic. Tenderness of the right lumbar paraspinal muscles on exam. The red flag symptoms or neurologic deficits to suggest cauda equina. Low suspicion for acute fracture or subluxation without trauma. Will treat for likely muscular strain. Recommended close follow-up with PCP.  Discussed plan with patient, she acknowledged understanding and agreed with plan of care.  Return precautions given for new or worsening symptoms.  Final Clinical Impressions(s) / ED Diagnoses   Final diagnoses:  Acute right-sided low back pain without sciatica    New Prescriptions New Prescriptions   METHOCARBAMOL (ROBAXIN) 500 MG TABLET    Take 1 tablet (500 mg total) by mouth 2 (two) times daily.   NAPROXEN (NAPROSYN)  500 MG TABLET    Take 1 tablet (500 mg total) by mouth 2 (two) times daily with a meal.   I personally performed the services described in this documentation, which was scribed in my presence. The recorded information has been reviewed and is accurate.    Garlon HatchetLisa M Roselyne Stalnaker, PA-C 12/25/16 1844    Samuel JesterKathleen McManus, DO 12/28/16 1348

## 2016-12-25 NOTE — ED Triage Notes (Signed)
Pt states that the lower back pain began at work at around 2200 last night.  Pt states that she has taken Aleve for pain, but it is not helping and that it feels like she has a knot in her back.

## 2016-12-25 NOTE — ED Notes (Signed)
Pt reports R lower back pain started last night. States she was lifting and twisting yesterday, injured same area to back approx 1 year ago. Denies bowel/bladder dysfunction.

## 2021-07-20 ENCOUNTER — Encounter (HOSPITAL_COMMUNITY): Payer: Self-pay | Admitting: *Deleted

## 2021-07-20 ENCOUNTER — Emergency Department (HOSPITAL_COMMUNITY): Payer: No Typology Code available for payment source

## 2021-07-20 ENCOUNTER — Emergency Department (HOSPITAL_COMMUNITY)
Admission: EM | Admit: 2021-07-20 | Discharge: 2021-07-20 | Disposition: A | Discharge status: Home / Self Care | Payer: No Typology Code available for payment source | Attending: Emergency Medicine | Admitting: Emergency Medicine

## 2021-07-20 ENCOUNTER — Other Ambulatory Visit: Payer: Self-pay

## 2021-07-20 DIAGNOSIS — R0602 Shortness of breath: Secondary | ICD-10-CM | POA: Diagnosis present

## 2021-07-20 DIAGNOSIS — U071 COVID-19: Secondary | ICD-10-CM | POA: Diagnosis not present

## 2021-07-20 MED ORDER — ALBUTEROL SULFATE HFA 108 (90 BASE) MCG/ACT IN AERS
1.0000 | INHALATION_SPRAY | Freq: Once | RESPIRATORY_TRACT | Status: AC
  Administered 2021-07-20: 1 via RESPIRATORY_TRACT
  Filled 2021-07-20: qty 6.7

## 2021-07-20 NOTE — ED Provider Notes (Signed)
[facility] EMERGENCY DEPARTMENT Provider Note   CSN: [id number] Arrival date & time: 07/20/21  1353     History Chief Complaint  Patient presents with   Covid Positive    Nicole Curtis is a 40 y.o. female.  HPI  Patient with no significant medical history presents to the emergency department with chief complaint of COVID.  Patient states her symptoms started Wednesday morning, and had a confirmed positive COVID test on Thursday, since then she has been in quarantine.  Patient has not had the antiviral treatment, has had her COVID-vaccine and 1 booster, she states that she is here today because she is feeling some chest tightness, shortness of breath, subjective fevers and chills, nasal congestion, body aches, fatigue.  Patient denies stomach pain, nausea, vomiting, diarrhea, states she still tolerating p.o., she denies worsening pedal edema, has no history of PEs or DVTs, currently not on hormone therapy, she has no similar cardiac history.    History reviewed. No pertinent past medical history.  There are no problems to display for this patient.   Past Surgical History:  Procedure Laterality Date   CESAREAN SECTION     HAND SURGERY     HERNIA REPAIR     TONSILLECTOMY       OB History     Gravida  4   Para  3   Term      Preterm  3   AB  1   Living         SAB  1   IAB      Ectopic      Multiple      Live Births              No family history on file.  Social History   Tobacco Use   Smoking status: Never   Smokeless tobacco: Never  Substance Use Topics   Alcohol use: No   Drug use: No    Home Medications Prior to Admission medications   Medication Sig Start Date End Date Taking? Authorizing Provider  amitriptyline (ELAVIL) 25 MG tablet Take 25 mg by mouth at bedtime. 12/23/14   [provider]  methocarbamol (ROBAXIN) 500 MG tablet Take 1 tablet (500 mg total) by mouth 2 (two) times daily. 12/25/16   Garlon Hatchet, PA-C   Multiple Vitamins-Calcium (ONE-A-DAY WOMENS PO) Take 1 tablet by mouth daily.    [provider]  naproxen (NAPROSYN) 500 MG tablet Take 1 tablet (500 mg total) by mouth 2 (two) times daily with a meal. 12/25/16   Garlon Hatchet, PA-C  SPRINTEC 28 0.25-35 MG-MCG tablet Take 1 tablet by mouth daily. 12/23/14   [provider]    Allergies    Bactrim [sulfamethoxazole-trimethoprim]  Review of Systems   Review of Systems  Constitutional:  Positive for chills, fatigue and fever.  HENT:  Positive for congestion. Negative for sore throat.   Respiratory:  Positive for shortness of breath.   Cardiovascular:  Negative for chest pain, palpitations and leg swelling.       Chest pressure   Gastrointestinal:  Negative for abdominal pain, diarrhea, nausea and vomiting.  Genitourinary:  Negative for enuresis.  Musculoskeletal:  Positive for myalgias. Negative for back pain.  Skin:  Negative for rash.  Neurological:  Positive for headaches. Negative for dizziness.  Hematological:  Does not bruise/bleed easily.   Physical Exam Updated Vital Signs BP (!) 179/98 (BP Location: Right Arm)   Pulse 71   Temp  99 F (37.2 C)   Resp 14   SpO2 98%   Physical Exam Vitals and nursing note reviewed.  Constitutional:      General: She is not in acute distress.    Appearance: She is not ill-appearing.  HENT:     Head: Normocephalic and atraumatic.     Right Ear: Tympanic membrane, ear canal and external ear normal.     Left Ear: Tympanic membrane, ear canal and external ear normal.     Nose: Congestion present.     Mouth/Throat:     Mouth: Mucous membranes are moist.     Pharynx: Oropharynx is clear. No oropharyngeal exudate or posterior oropharyngeal erythema.  Eyes:     Conjunctiva/sclera: Conjunctivae normal.  Cardiovascular:     Rate and Rhythm: Normal rate and regular rhythm.     Pulses: Normal pulses.     Heart sounds: No murmur heard.   No friction rub. No gallop.   Pulmonary:     Effort: No respiratory distress.     Breath sounds: No wheezing, rhonchi or rales.  Abdominal:     Palpations: Abdomen is soft.     Tenderness: There is no abdominal tenderness.  Musculoskeletal:     Right lower leg: No edema.     Left lower leg: No edema.  Skin:    General: Skin is warm and dry.  Neurological:     Mental Status: She is alert.  Psychiatric:        Mood and Affect: Mood normal.    ED Results / Procedures / Treatments   Labs (all labs ordered are listed, but only abnormal results are displayed) Labs Reviewed - No data to display  EKG None  Radiology DG Chest Portable 1 View  Result Date: 07/20/2021 CLINICAL DATA:  COVID positive EXAM: PORTABLE CHEST 1 VIEW COMPARISON:  01/19/2015 FINDINGS: Mild bilateral interstitial thickening as can be seen with atypical viral infection such as COVID-19. No focal consolidation. No pleural effusion or pneumothorax. Heart and mediastinal contours are unremarkable. No acute osseous abnormality. IMPRESSION: Mild bilateral interstitial thickening as can be seen with atypical viral infection such as COVID-19. Electronically Signed   By: Elige Ko M.D.   On: 07/20/2021 16:02    Procedures Procedures   Medications Ordered in ED Medications  albuterol (VENTOLIN HFA) 108 (90 Base) MCG/ACT inhaler 1 puff (1 puff Inhalation Given 07/20/21 1608)    ED Course  I have reviewed the triage vital signs and the nursing notes.  Pertinent labs & imaging results that were available during my care of the patient were reviewed by me and considered in my medical decision making (see chart for details).    MDM Rules/Calculators/A&P                          Initial impression-patient presents with COVID.  She is alert, does not appear in acute distress, vital signs reassuring.Triage obtain chest x-ray, EKG  Work-up-EKG sinus without signs of ischemia.  Chest x-ray shows atypical viral infection.Consistent with COVID  Rule  out- Low suspicion for systemic infection as patient is nontoxic-appearing, vital signs reassuring, no obvious source infection noted on exam.  Low suspicion for pneumonia as lung sounds are clear bilaterally, X-ray reveals atypical viral infection.  I have low suspicion for PE as patient denies pleuritic chest pain, shortness of breath, patient is PERC. low suspicion for strep throat as oropharynx was visualized, no erythema or exudates noted.  Low  suspicion patient would need  hospitalized due to viral infection or Covid as vital signs reassuring, patient is not in respiratory distress.  I have low suspicion for ACS as patient has low risk factors, presentation is atypical for etiology, more consistent with viral URI EKG without signs of ischemia.   Plan-  COVID infection- will provide patient with an albuterol inhaler for comfort, will recommend over-the-counter pain medications, follow-up with post-COVID care for further evaluation.  Vital signs have remained stable, no indication for hospital admission.  Patient given at home care as well strict return precautions.  Patient verbalized that they understood agreed to said plan.  Final Clinical Impression(s) / ED Diagnoses Final diagnoses:  COVID    Rx / DC Orders ED Discharge Orders     None        Carroll Sage, PA-C 07/20/21 1619    Franne Forts, DO 07/21/21 2221

## 2021-07-20 NOTE — ED Triage Notes (Signed)
Tested covid positive 4 days ago, states she is short of breath today.

## 2021-07-20 NOTE — ED Notes (Signed)
Ok to discharge home with BP 168/102.  Pt informed to of vital signs.

## 2021-07-20 NOTE — Discharge Instructions (Addendum)
You have covid, I provided you with an albuterol inhaler please use as needed for chest tightness, you may do 1 to 2 puffs every 4-6 hours as needed.  This medication can increase your heart rate.  I recommend taking Tylenol for fever control and ibuprofen for pain control please follow dosing on the back of bottle.  I recommend staying hydrated and if you do not an appetite, I recommend soups as this will provide you with fluids and calories.     you must self quarantine for 5 days starting on symptom onset, if at the end of those 5 days you are feeling better you may return back to school/work, if you continue to have symptoms you must self quarantine for additional 5 days.  I would like you to contact "post Covid care" as they will provide you with information how to manage your Covid symptoms   Come back to the emergency department if you develop chest pain, shortness of breath, severe abdominal pain, uncontrolled nausea, vomiting, diarrhea.

## 2021-10-15 ENCOUNTER — Other Ambulatory Visit (HOSPITAL_COMMUNITY): Payer: Self-pay

## 2021-10-15 MED ORDER — SLYND 4 MG PO TABS
1.0000 | ORAL_TABLET | Freq: Every day | ORAL | 0 refills | Status: AC
  Filled 2021-10-15: qty 84, 84d supply, fill #0

## 2021-10-25 ENCOUNTER — Other Ambulatory Visit (HOSPITAL_COMMUNITY): Payer: Self-pay

## 2021-10-26 ENCOUNTER — Other Ambulatory Visit (HOSPITAL_COMMUNITY): Payer: Self-pay

## 2021-10-26 MED ORDER — TRANEXAMIC ACID 650 MG PO TABS
1300.0000 mg | ORAL_TABLET | Freq: Three times a day (TID) | ORAL | 1 refills | Status: AC
  Filled 2021-10-26: qty 30, 5d supply, fill #0

## 2021-10-28 ENCOUNTER — Other Ambulatory Visit (HOSPITAL_COMMUNITY): Payer: Self-pay

## 2022-04-16 IMAGING — DX DG CHEST 1V PORT
1 series · 1 of 1 positions shown · non-contrast
Comparison: 01/19/2015

CLINICAL DATA: COVID positive

EXAM:
PORTABLE CHEST 1 VIEW

[chest ap]
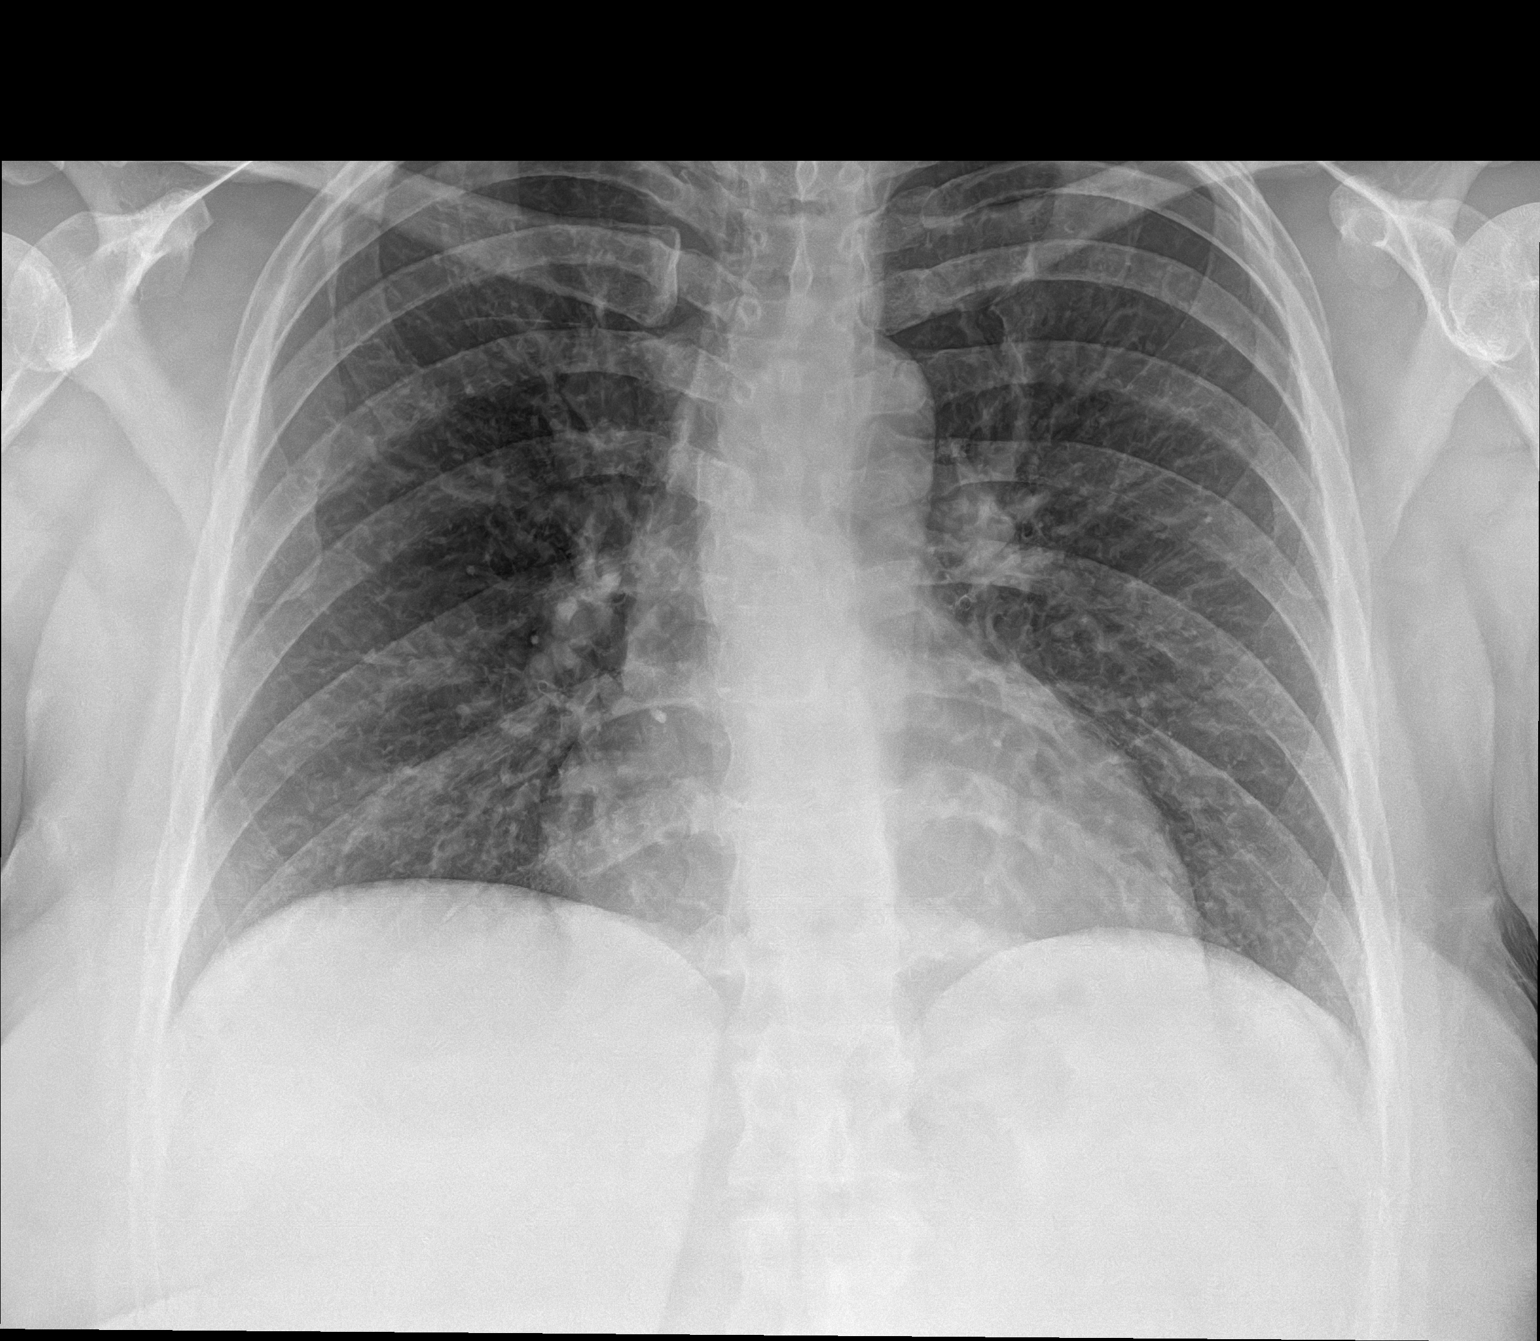

[1 of 1 positions shown; findings below may reference images not displayed]

FINDINGS: Mild bilateral interstitial thickening as can be seen with atypical
viral infection such as [id number]. No focal consolidation. No pleural
effusion or pneumothorax. Heart and mediastinal contours are
unremarkable.

No acute osseous abnormality.
IMPRESSION: Mild bilateral interstitial thickening as can be seen with atypical
viral infection such as [id number].

## 2022-12-23 ENCOUNTER — Other Ambulatory Visit (HOSPITAL_BASED_OUTPATIENT_CLINIC_OR_DEPARTMENT_OTHER): Payer: Self-pay

## 2023-08-08 ENCOUNTER — Emergency Department (HOSPITAL_COMMUNITY)
Admission: EM | Admit: 2023-08-08 | Discharge: 2023-08-09 | Disposition: A | Discharge status: Home / Self Care | Payer: BC Managed Care – PPO | Attending: Emergency Medicine | Admitting: Emergency Medicine

## 2023-08-08 ENCOUNTER — Emergency Department (HOSPITAL_COMMUNITY): Payer: BC Managed Care – PPO

## 2023-08-08 ENCOUNTER — Other Ambulatory Visit: Payer: Self-pay

## 2023-08-08 DIAGNOSIS — T22232A Burn of second degree of left upper arm, initial encounter: Secondary | ICD-10-CM | POA: Diagnosis not present

## 2023-08-08 DIAGNOSIS — Z79899 Other long term (current) drug therapy: Secondary | ICD-10-CM | POA: Insufficient documentation

## 2023-08-08 DIAGNOSIS — Y92410 Unspecified street and highway as the place of occurrence of the external cause: Secondary | ICD-10-CM | POA: Diagnosis not present

## 2023-08-08 DIAGNOSIS — X17XXXA Contact with hot engines, machinery and tools, initial encounter: Secondary | ICD-10-CM | POA: Diagnosis not present

## 2023-08-08 DIAGNOSIS — I10 Essential (primary) hypertension: Secondary | ICD-10-CM | POA: Diagnosis not present

## 2023-08-08 DIAGNOSIS — S299XXA Unspecified injury of thorax, initial encounter: Secondary | ICD-10-CM | POA: Diagnosis present

## 2023-08-08 DIAGNOSIS — S2242XA Multiple fractures of ribs, left side, initial encounter for closed fracture: Secondary | ICD-10-CM | POA: Diagnosis not present

## 2023-08-08 LAB — CBC
HCT: 39.8 % (ref 36.0–46.0)
Hemoglobin: 13.7 g/dL (ref 12.0–15.0)
MCH: 29.5 pg (ref 26.0–34.0)
MCHC: 34.4 g/dL (ref 30.0–36.0)
MCV: 85.8 fL (ref 80.0–100.0)
Platelets: 345 10*3/uL (ref 150–400)
RBC: 4.64 MIL/uL (ref 3.87–5.11)
RDW: 12.3 % (ref 11.5–15.5)
WBC: 11.5 10*3/uL — ABNORMAL HIGH (ref 4.0–10.5)
nRBC: 0 % (ref 0.0–0.2)

## 2023-08-08 LAB — BASIC METABOLIC PANEL
Anion gap: 15 (ref 5–15)
BUN: 10 mg/dL (ref 6–20)
CO2: 23 mmol/L (ref 22–32)
Calcium: 9.1 mg/dL (ref 8.9–10.3)
Chloride: 100 mmol/L (ref 98–111)
Creatinine, Ser: 0.97 mg/dL (ref 0.44–1.00)
GFR, Estimated: >60 mL/min (ref >60)
Glucose, Bld: 96 mg/dL (ref 70–99)
Potassium: 3.5 mmol/L (ref 3.5–5.1)
Sodium: 138 mmol/L (ref 135–145)

## 2023-08-08 LAB — TROPONIN I (HIGH SENSITIVITY): Troponin I (High Sensitivity): 5 ng/L (ref <18)

## 2023-08-08 LAB — HCG, SERUM, QUALITATIVE: Preg, Serum: NEGATIVE
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
## 2023-08-09 ENCOUNTER — Encounter (HOSPITAL_COMMUNITY): Payer: Self-pay

## 2023-08-09 ENCOUNTER — Telehealth (HOSPITAL_COMMUNITY): Payer: Self-pay | Admitting: Emergency Medicine

## 2023-08-09 MED ORDER — CYCLOBENZAPRINE HCL 10 MG PO TABS: 10.0000 mg | ORAL_TABLET | Freq: Two times a day (BID) | ORAL | 0 refills | Status: AC | PRN

## 2023-08-09 MED ORDER — OXYCODONE-ACETAMINOPHEN 5-325 MG PO TABS: 1.0000 | ORAL_TABLET | Freq: Four times a day (QID) | ORAL | 0 refills | Status: AC | PRN
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
## 2023-08-10 ENCOUNTER — Telehealth: Payer: Self-pay

## 2023-08-10 NOTE — Telephone Encounter (Signed)
Patient called in to state she forgot to get a note  when she came into the ED. Spoke with Dr Freida Busman, Dr Madilyn Hook who saw the patient was not on today. He reviewed chart and note was written to return to work on August 21, 2023. Note sent to email per request. [email or web address] and [email or web address].

## 2024-01-10 ENCOUNTER — Other Ambulatory Visit (HOSPITAL_BASED_OUTPATIENT_CLINIC_OR_DEPARTMENT_OTHER): Payer: Self-pay

## 2024-05-27 ENCOUNTER — Encounter (INDEPENDENT_AMBULATORY_CARE_PROVIDER_SITE_OTHER): Payer: Self-pay

## 2024-07-10 ENCOUNTER — Encounter (INDEPENDENT_AMBULATORY_CARE_PROVIDER_SITE_OTHER): Payer: Self-pay | Admitting: Family Medicine

## 2024-07-10 ENCOUNTER — Ambulatory Visit (INDEPENDENT_AMBULATORY_CARE_PROVIDER_SITE_OTHER): Admitting: Family Medicine

## 2024-07-10 VITALS — BP 142/87 | HR 67 | Temp 98.4°F | Ht 63.0 in | Wt 204.0 lb

## 2024-07-10 DIAGNOSIS — Z0289 Encounter for other administrative examinations: Secondary | ICD-10-CM

## 2024-07-10 DIAGNOSIS — F39 Unspecified mood [affective] disorder: Secondary | ICD-10-CM | POA: Diagnosis not present

## 2024-07-10 DIAGNOSIS — Z6836 Body mass index (BMI) 36.0-36.9, adult: Secondary | ICD-10-CM

## 2024-07-10 DIAGNOSIS — I10 Essential (primary) hypertension: Secondary | ICD-10-CM

## 2024-07-10 DIAGNOSIS — E66812 Obesity, class 2: Secondary | ICD-10-CM
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
## 2024-08-12 ENCOUNTER — Ambulatory Visit (INDEPENDENT_AMBULATORY_CARE_PROVIDER_SITE_OTHER): Admitting: Nurse Practitioner

## 2024-08-26 ENCOUNTER — Ambulatory Visit (INDEPENDENT_AMBULATORY_CARE_PROVIDER_SITE_OTHER): Admitting: Nurse Practitioner
